# Patient Record
Sex: Male | Born: 1970 | Race: White | Hispanic: No | Marital: Married | Smoking: Never smoker
Health system: Southern US, Community
[De-identification: ages and names within clinical notes are randomized; demographics above are authoritative.]

## PROBLEM LIST (undated history)

## (undated) DIAGNOSIS — L409 Psoriasis, unspecified: Secondary | ICD-10-CM

## (undated) DIAGNOSIS — R17 Unspecified jaundice: Secondary | ICD-10-CM

## (undated) DIAGNOSIS — E7800 Pure hypercholesterolemia, unspecified: Secondary | ICD-10-CM

## (undated) DIAGNOSIS — H5461 Unqualified visual loss, right eye, normal vision left eye: Secondary | ICD-10-CM

## (undated) DIAGNOSIS — G8929 Other chronic pain: Secondary | ICD-10-CM

## (undated) DIAGNOSIS — N529 Male erectile dysfunction, unspecified: Secondary | ICD-10-CM

## (undated) DIAGNOSIS — J302 Other seasonal allergic rhinitis: Secondary | ICD-10-CM

## (undated) DIAGNOSIS — A6000 Herpesviral infection of urogenital system, unspecified: Secondary | ICD-10-CM

---

## 2019-08-29 ENCOUNTER — Other Ambulatory Visit: Payer: Self-pay | Admitting: Family Medicine

## 2019-08-29 DIAGNOSIS — E010 Iodine-deficiency related diffuse (endemic) goiter: Secondary | ICD-10-CM

## 2019-09-05 ENCOUNTER — Ambulatory Visit: Payer: 59

## 2019-09-25 ENCOUNTER — Ambulatory Visit
Admission: RE | Admit: 2019-09-25 | Discharge: 2019-09-25 | Disposition: A | Discharge status: Home / Self Care | Payer: 59 | Source: Ambulatory Visit | Attending: Family Medicine | Admitting: Family Medicine

## 2019-09-25 ENCOUNTER — Other Ambulatory Visit: Payer: Self-pay

## 2019-09-25 DIAGNOSIS — E010 Iodine-deficiency related diffuse (endemic) goiter: Secondary | ICD-10-CM | POA: Diagnosis not present

## 2020-05-10 ENCOUNTER — Other Ambulatory Visit: Admission: RE | Admit: 2020-05-10 | Payer: 59 | Source: Ambulatory Visit

## 2020-05-12 ENCOUNTER — Ambulatory Visit
Admission: RE | Admit: 2020-05-12 | Payer: Managed Care, Other (non HMO) | Source: Home / Self Care | Admitting: Internal Medicine

## 2020-05-12 ENCOUNTER — Encounter: Admission: RE | Payer: Self-pay | Source: Home / Self Care

## 2020-05-12 SURGERY — COLONOSCOPY WITH PROPOFOL
Anesthesia: General

## 2020-05-31 ENCOUNTER — Other Ambulatory Visit: Payer: Self-pay

## 2020-05-31 ENCOUNTER — Other Ambulatory Visit
Admission: RE | Admit: 2020-05-31 | Discharge: 2020-05-31 | Disposition: A | Discharge status: Home / Self Care | Payer: Managed Care, Other (non HMO) | Source: Ambulatory Visit | Attending: Internal Medicine | Admitting: Internal Medicine

## 2020-05-31 DIAGNOSIS — Z01812 Encounter for preprocedural laboratory examination: Secondary | ICD-10-CM | POA: Diagnosis not present

## 2020-05-31 DIAGNOSIS — U071 COVID-19: Secondary | ICD-10-CM | POA: Diagnosis not present

## 2020-05-31 LAB — SARS CORONAVIRUS 2 (TAT 6-24 HRS): SARS Coronavirus 2: POSITIVE — AB

## 2020-06-02 ENCOUNTER — Encounter: Admission: RE | Payer: Self-pay | Source: Home / Self Care

## 2020-06-02 ENCOUNTER — Ambulatory Visit
Admission: RE | Admit: 2020-06-02 | Payer: Managed Care, Other (non HMO) | Source: Home / Self Care | Admitting: Internal Medicine

## 2020-06-02 ENCOUNTER — Telehealth: Payer: Self-pay | Admitting: *Deleted

## 2020-06-02 SURGERY — COLONOSCOPY WITH PROPOFOL
Anesthesia: General

## 2020-06-02 NOTE — Telephone Encounter (Signed)
Called to discuss with patient about COVID-19 symptoms and the use of one of the available treatments for those with mild to moderate Covid symptoms and at a high risk of hospitalization.  Pt appears to qualify for outpatient treatment due to co-morbid conditions and/or a member of an at-risk group in accordance with the FDA Emergency Use Authorization.   Patient reported no symptoms.       Geoffrey Johnson

## 2020-07-26 ENCOUNTER — Other Ambulatory Visit: Admission: RE | Admit: 2020-07-26 | Payer: Managed Care, Other (non HMO) | Source: Ambulatory Visit

## 2020-07-27 ENCOUNTER — Encounter: Payer: Self-pay | Admitting: Internal Medicine

## 2020-07-28 ENCOUNTER — Encounter
Admission: RE | Disposition: A | Discharge status: Home / Self Care | Payer: Self-pay | Source: Home / Self Care | Attending: Internal Medicine

## 2020-07-28 ENCOUNTER — Ambulatory Visit
Admission: RE | Admit: 2020-07-28 | Discharge: 2020-07-28 | Disposition: A | Discharge status: Home / Self Care | Payer: Managed Care, Other (non HMO) | Attending: Internal Medicine | Admitting: Internal Medicine

## 2020-07-28 ENCOUNTER — Encounter: Payer: Self-pay | Admitting: Internal Medicine

## 2020-07-28 ENCOUNTER — Ambulatory Visit: Payer: Managed Care, Other (non HMO) | Admitting: Anesthesiology

## 2020-07-28 ENCOUNTER — Other Ambulatory Visit: Payer: Self-pay

## 2020-07-28 DIAGNOSIS — Z1211 Encounter for screening for malignant neoplasm of colon: Secondary | ICD-10-CM | POA: Diagnosis not present

## 2020-07-28 DIAGNOSIS — K640 First degree hemorrhoids: Secondary | ICD-10-CM | POA: Diagnosis not present

## 2020-07-28 DIAGNOSIS — K5730 Diverticulosis of large intestine without perforation or abscess without bleeding: Secondary | ICD-10-CM | POA: Insufficient documentation

## 2020-07-28 HISTORY — PX: COLONOSCOPY WITH PROPOFOL: SHX5780

## 2020-07-28 HISTORY — DX: Pure hypercholesterolemia, unspecified: E78.00

## 2020-07-28 HISTORY — DX: Other seasonal allergic rhinitis: J30.2

## 2020-07-28 HISTORY — DX: Male erectile dysfunction, unspecified: N52.9

## 2020-07-28 HISTORY — DX: Unqualified visual loss, right eye, normal vision left eye: H54.61

## 2020-07-28 HISTORY — DX: Psoriasis, unspecified: L40.9

## 2020-07-28 HISTORY — DX: Herpesviral infection of urogenital system, unspecified: A60.00

## 2020-07-28 HISTORY — DX: Unspecified jaundice: R17

## 2020-07-28 HISTORY — DX: Other chronic pain: G89.29

## 2020-07-28 SURGERY — COLONOSCOPY WITH PROPOFOL
Anesthesia: General

## 2020-07-28 MED ORDER — SODIUM CHLORIDE 0.9 % IV SOLN
INTRAVENOUS | Status: DC
  Administered 2020-07-28: 20 mL/h via INTRAVENOUS

## 2020-07-28 MED ORDER — PROPOFOL 500 MG/50ML IV EMUL
INTRAVENOUS | Status: DC | PRN
  Administered 2020-07-28: 200 ug/kg/min via INTRAVENOUS

## 2020-07-28 MED ORDER — PROPOFOL 10 MG/ML IV BOLUS
INTRAVENOUS | Status: DC | PRN
  Administered 2020-07-28 (×2): 20 mg via INTRAVENOUS
  Administered 2020-07-28: 60 mg via INTRAVENOUS

## 2020-07-28 NOTE — Anesthesia Preprocedure Evaluation (Signed)
Anesthesia Evaluation  Patient identified by MRN, date of birth, ID band Patient awake    Reviewed: Allergy & Precautions, NPO status , Patient's Chart, lab work & pertinent test results  Airway Mallampati: II  TM Distance: >3 FB     Dental   Pulmonary  Sinus allergies   Pulmonary exam normal        Cardiovascular negative cardio ROS Normal cardiovascular exam     Neuro/Psych negative neurological ROS  negative psych ROS   GI/Hepatic negative GI ROS, Elevated bili   Endo/Other  negative endocrine ROS  Renal/GU negative Renal ROS     Musculoskeletal  (+) Arthritis ,   Abdominal Normal abdominal exam  (+)   Peds negative pediatric ROS (+)  Hematology negative hematology ROS (+)   Anesthesia Other Findings Past Medical History: No date: Chronic pain in right foot No date: Elevated bilirubin No date: Erectile dysfunction 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 No date: Hypercholesterolemia No date: Psoriasis No date: Seasonal allergies No date: Visual loss, right eye  Reproductive/Obstetrics                             Anesthesia Physical Anesthesia Plan  ASA: II  Anesthesia Plan: General   Post-op Pain Management:    Induction: Intravenous  PONV Risk Score and Plan: Propofol infusion  Airway Management Planned: Nasal Cannula  Additional Equipment:   Intra-op Plan:   Post-operative Plan:   Informed Consent: I have reviewed the patients History and Physical, chart, labs and discussed the procedure including the risks, benefits and alternatives for the proposed anesthesia with the patient or authorized representative who has indicated his/her understanding and acceptance.     Dental advisory given  Plan Discussed with: CRNA and Surgeon  Anesthesia Plan Comments:         Anesthesia Quick Evaluation

## 2020-07-28 NOTE — H&P (Signed)
  Outpatient short stay form Pre-procedure 07/28/2020 10:05 AM Geoffrey Allie K. Geoffrey Johnson, M.D.  Primary Physician: Angus Palms MD  Reason for visit: Colon cancer screening  History of present illness:  Patient presents for colonoscopy for colon cancer screening. The patient denies complaints of abdominal pain, significant change in bowel habits, or rectal bleeding.     Current Facility-Administered Medications:  .  0.9 %  sodium chloride infusion, , Intravenous, Continuous, [place], Boykin Nearing, MD, Last Rate: 20 mL/hr at 07/28/20 0959, 20 mL/hr at 07/28/20 0959  Medications Prior to Admission  Medication Sig Dispense Refill Last Dose  . calcipotriene (DOVONOX) 0.005 % cream Apply topically 2 (two) times daily.   Past Week at Unknown time  . Calcipotriene 0.005 % FOAM Apply topically.   07/27/2020 at Unknown time  . clobetasol cream (TEMOVATE) 0.05 % Apply 1 application topically 2 (two) times daily.   07/27/2020 at Unknown time  . sildenafil (REVATIO) 20 MG tablet Take 20 mg by mouth 3 (three) times daily.   Past Week at Unknown time  . tazarotene (AVAGE) 0.1 % cream Apply topically at bedtime.   07/27/2020 at Unknown time  . Tazarotene 0.1 % FOAM Apply topically.   07/27/2020 at Unknown time     Not on File   Past Medical History:  Diagnosis Date  . Chronic pain in right foot   . Elevated bilirubin   . Erectile dysfunction   . Genital HSV   . Hypercholesterolemia   . Psoriasis   . Seasonal allergies   . Visual loss, right eye     Review of systems:  Otherwise negative.    Physical Exam  Gen: Alert, oriented. Appears stated age.  HEENT: [place]/AT. PERRLA. Lungs: CTA, no wheezes. CV: RR nl S1, S2. Abd: soft, benign, no masses. BS+ Ext: No edema. Pulses 2+    Planned procedures: Proceed with colonoscopy. The patient understands the nature of the planned procedure, indications, risks, alternatives and potential complications including but not limited to bleeding, infection,  perforation, damage to internal organs and possible oversedation/side effects from anesthesia. The patient agrees and gives consent to proceed.  Please refer to procedure notes for findings, recommendations and patient disposition/instructions.     Fidelis Loth K. Geoffrey Johnson, M.D. Gastroenterology 07/28/2020  10:05 AM

## 2020-07-28 NOTE — Op Note (Signed)
[facility] Gastroenterology Patient Name: Geoffrey Johnson Procedure Date: 07/28/2020 10:56 AM MRN: [id number] Account #: [id number] Date of Birth: 1970-10-23 Admit Type: Outpatient Age: 50 Room: [facility] ENDO ROOM 2 Gender: Male Note Status: Finalized Procedure:             Colonoscopy Indications:           Screening for colorectal malignant neoplasm Providers:             Boykin Nearing. Norma Fredrickson MD, MD Referring MD:          Marylin Crosby. Greggory Stallion MD, MD (Referring MD) Medicines:             Propofol per Anesthesia Complications:         No immediate complications. Procedure:             Pre-Anesthesia Assessment:                        - The risks and benefits of the procedure and the                         sedation options and risks were discussed with the                         patient. All questions were answered and informed                         consent was obtained.                        - Patient identification and proposed procedure were                         verified prior to the procedure by the nurse. The                         procedure was verified in the procedure room.                        - ASA Grade Assessment: II - A patient with mild                         systemic disease.                        - After reviewing the risks and benefits, the patient                         was deemed in satisfactory condition to undergo the                         procedure.                        After obtaining informed consent, the colonoscope was                         passed under direct vision. Throughout the procedure,                         the patient's blood  pressure, pulse, and oxygen                         saturations were monitored continuously. The                         Colonoscope was introduced through the anus and                         advanced to the the cecum, identified by appendiceal                         orifice and ileocecal valve.  The colonoscopy was                         performed without difficulty. The patient tolerated                         the procedure well. The quality of the bowel                         preparation was excellent. The ileocecal valve,                         appendiceal orifice, and rectum were photographed. Findings:      The perianal and digital rectal examinations were normal. Pertinent       negatives include normal sphincter tone and no palpable rectal lesions.      Non-bleeding internal hemorrhoids were found during retroflexion. The       hemorrhoids were Grade I (internal hemorrhoids that do not prolapse).      Many small-mouthed diverticula were found in the sigmoid colon. There       was no evidence of diverticular bleeding.      A 5 mm polyp was found in the distal sigmoid colon. The polyp was       sessile. The polyp was removed with a jumbo cold forceps. Resection and       retrieval were complete.      The exam was otherwise without abnormality. Impression:            - Non-bleeding internal hemorrhoids.                        - Mild diverticulosis in the sigmoid colon. There was                         no evidence of diverticular bleeding.                        - One 5 mm polyp in the distal sigmoid colon, removed                         with a jumbo cold forceps. Resected and retrieved.                        - The examination was otherwise normal. Recommendation:        - Patient has a contact number available for  emergencies. The signs and symptoms of potential                         delayed complications were discussed with the patient.                         Return to normal activities tomorrow. Written                         discharge instructions were provided to the patient.                        - Resume previous diet.                        - Continue present medications.                        - Repeat colonoscopy is recommended  for surveillance.                         The colonoscopy date will be determined after                         pathology results from today's exam become available                         for review.                        - Return to GI office PRN.                        - The findings and recommendations were discussed with                         the patient. Procedure Code(s):     --- Professional ---                        [id number], Colonoscopy, flexible; with biopsy, single or                         multiple Diagnosis Code(s):     --- Professional ---                        K57.30, Diverticulosis of large intestine without                         perforation or abscess without bleeding                        K63.5, Polyp of colon                        K64.0, First degree hemorrhoids                        Z12.11, Encounter for screening for malignant neoplasm                         of colon CPT  copyright 2019 [employer] Medical Association. All rights reserved. The codes documented in this report are preliminary and upon coder review may  be revised to meet current compliance requirements. Stanton Kidney MD, MD 07/28/2020 11:30:14 AM This report has been signed electronically. Number of Addenda: 0 Note Initiated On: 07/28/2020 10:56 AM Scope Withdrawal Time: 0 hours 8 minutes 31 seconds  Total Procedure Duration: 0 hours 11 minutes 42 seconds  Estimated Blood Loss:  Estimated blood loss: none.      [facility]

## 2020-07-28 NOTE — Interval H&P Note (Signed)
History and Physical Interval Note:  07/28/2020 11:01 AM  Geoffrey Johnson  has presented today for surgery, with the diagnosis of COLON CANCER SCREENING.  The various methods of treatment have been discussed with the patient and family. After consideration of risks, benefits and other options for treatment, the patient has consented to  Procedure(s) with comments: COLONOSCOPY WITH PROPOFOL (N/A) - COVID POSITIVE 05/31/2020 as a surgical intervention.  The patient's history has been reviewed, patient examined, no change in status, stable for surgery.  I have reviewed the patient's chart and labs.  Questions were answered to the patient's satisfaction.     [place], [place]

## 2020-07-28 NOTE — Transfer of Care (Signed)
Immediate Anesthesia Transfer of Care Note  Patient: Geoffrey Johnson  Procedure(s) Performed: COLONOSCOPY WITH PROPOFOL (N/A )  Patient Location: PACU  Anesthesia Type:General  Level of Consciousness: awake, alert  and oriented  Airway & Oxygen Therapy: Patient Spontanous Breathing and Patient connected to nasal cannula oxygen  Post-op Assessment: Report given to RN and Post -op Vital signs reviewed and stable  Post vital signs: Reviewed and stable  Last Vitals:  Vitals Value Taken Time  BP    Temp    Pulse    Resp    SpO2      Last Pain:  Vitals:   07/28/20 0946  TempSrc: Temporal  PainSc: 0-No pain         Complications: No complications documented.

## 2020-07-28 NOTE — Anesthesia Procedure Notes (Signed)
Performed by: Kariana Wiles, CRNA Pre-anesthesia Checklist: Patient identified, Emergency Drugs available, Suction available, Patient being monitored and Timeout performed Oxygen Delivery Method: Nasal cannula       

## 2020-07-29 ENCOUNTER — Encounter: Payer: Self-pay | Admitting: Internal Medicine

## 2020-07-29 LAB — SURGICAL PATHOLOGY

## 2020-07-29 NOTE — Anesthesia Postprocedure Evaluation (Signed)
Anesthesia Post Note  Patient: Geoffrey Johnson  Procedure(s) Performed: COLONOSCOPY WITH PROPOFOL (N/A )  Patient location during evaluation: Endoscopy Anesthesia Type: General Level of consciousness: awake and alert and oriented Pain management: pain level controlled Vital Signs Assessment: post-procedure vital signs reviewed and stable Respiratory status: spontaneous breathing Cardiovascular status: blood pressure returned to baseline Anesthetic complications: no   No complications documented.   Last Vitals:  Vitals:   07/28/20 1139 07/28/20 1148  BP: 114/88 (!) 123/92  Pulse: (!) 49 (!) 58  Resp: 12   Temp:    SpO2: 100% 100%    Last Pain:  Vitals:   07/29/20 0720  TempSrc:   PainSc: 0-No pain                 Shawntee Mainwaring

## 2022-01-19 IMAGING — US US THYROID
1 series · 14 of 25 positions shown · non-contrast
Comparison: None.

CLINICAL DATA: Right thyroid enlargement

EXAM:
THYROID ULTRASOUND
TECHNIQUE: Ultrasound examination of the thyroid gland and adjacent soft
tissues was performed.

[Series 1: us thyroid · 0.07mm/px · 14 of 41 slices shown]
[im 1/41]
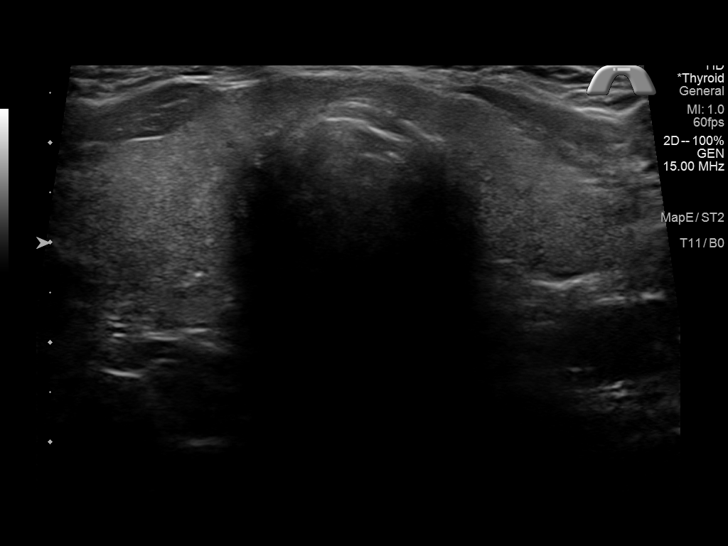
[im 4/41]
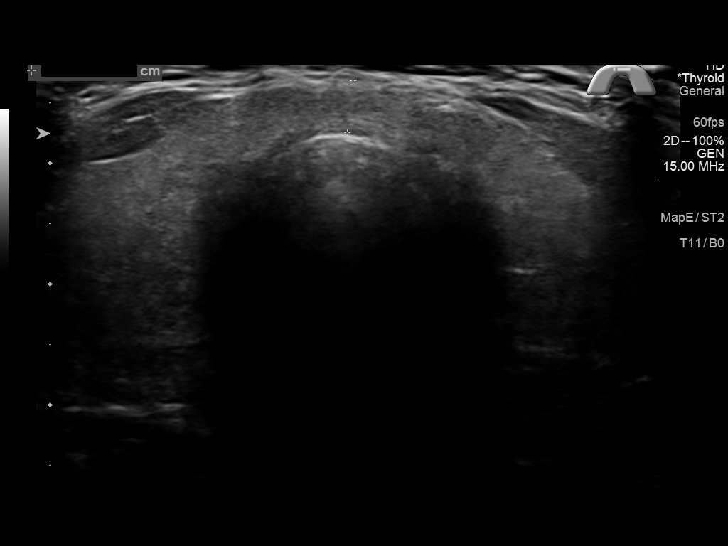
[im 7/41]
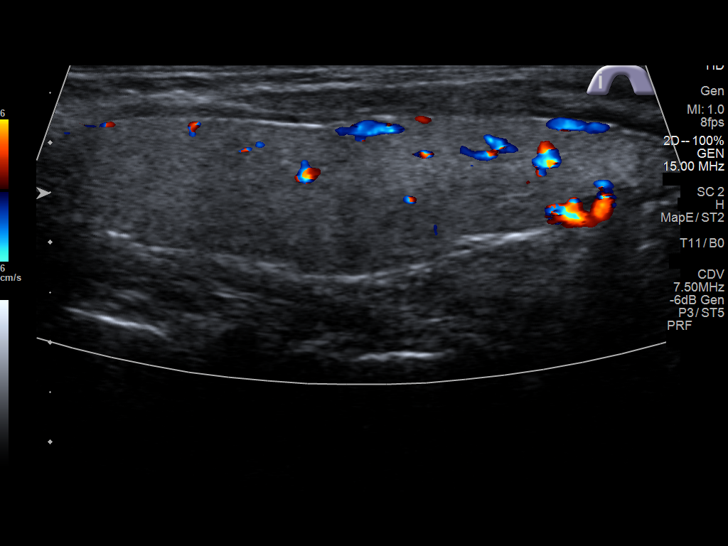
[im 11/41]
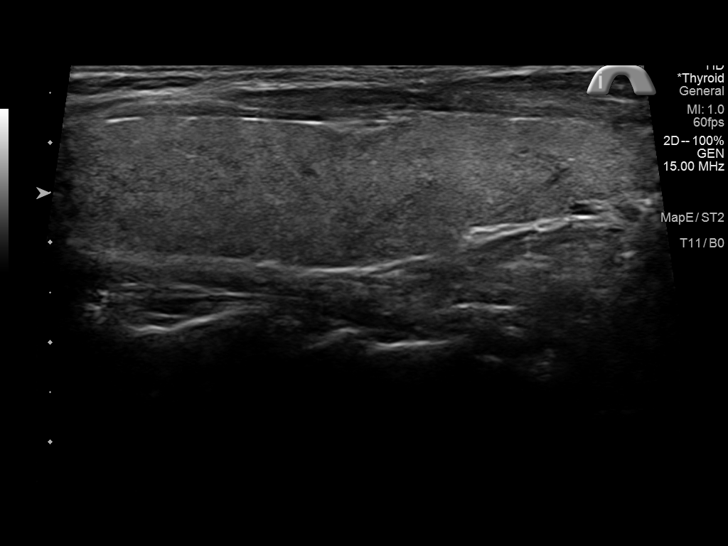
[im 14/41]
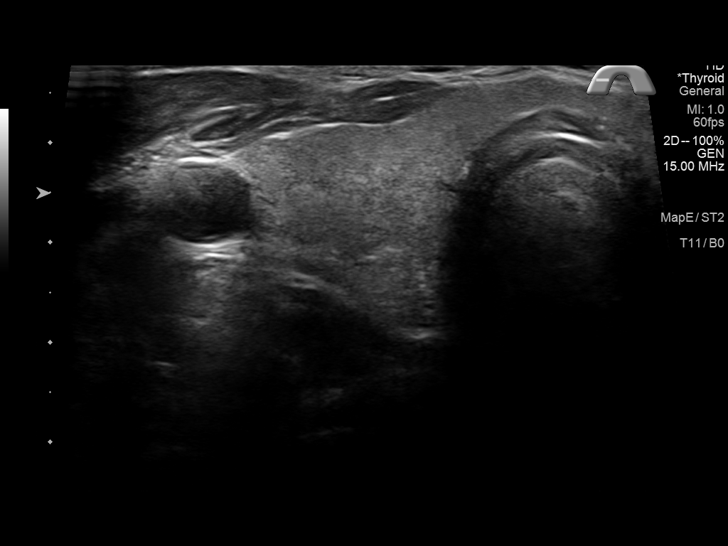
[im 16/41]
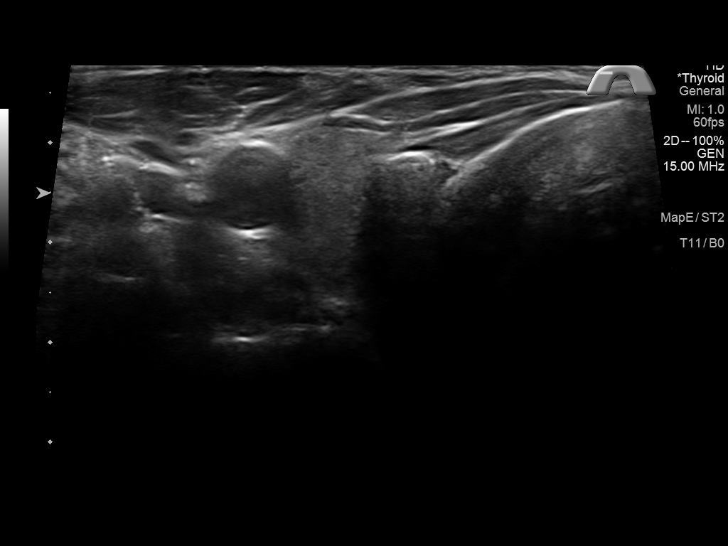
[im 19/41]
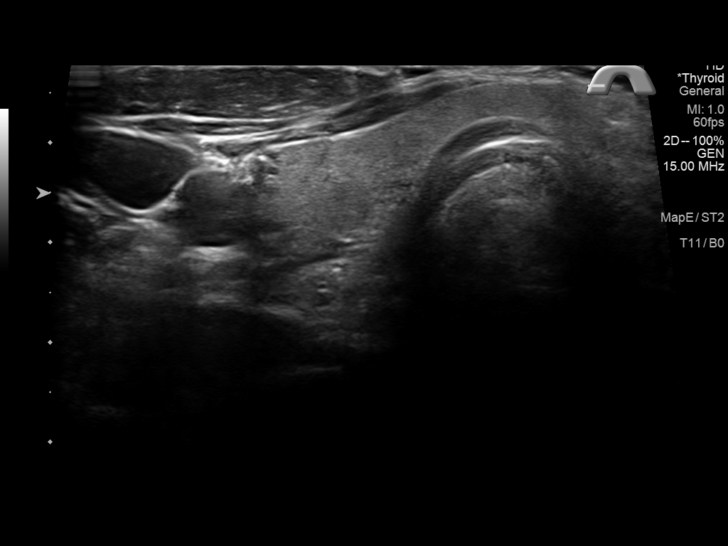
[im 22/41]
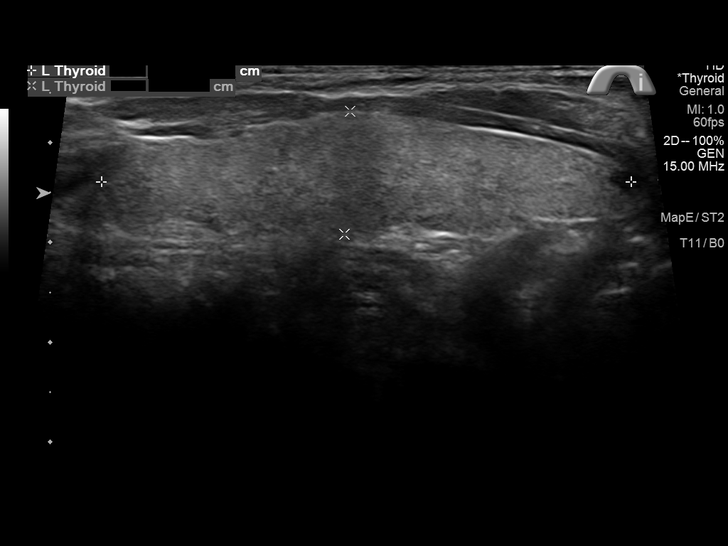
[im 26/41]
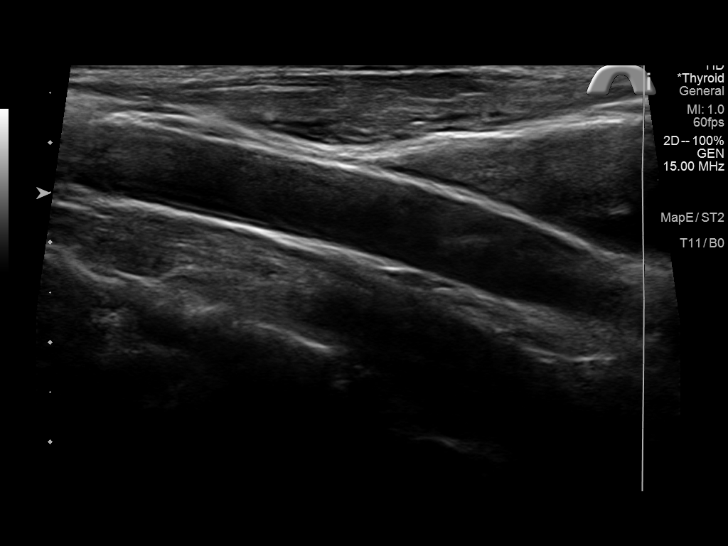
[im 27/41]
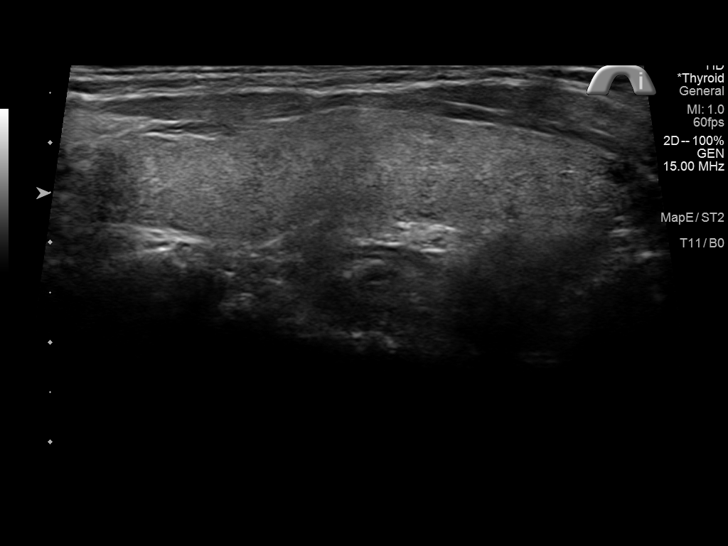
[im 31/41]
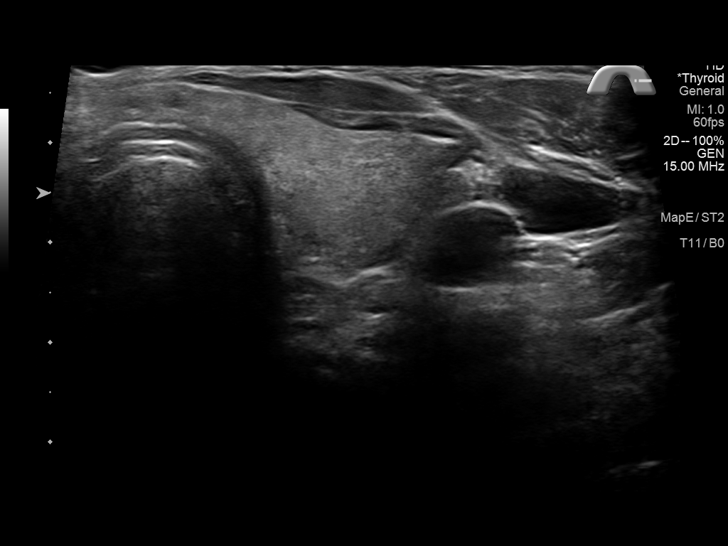
[im 34/41]
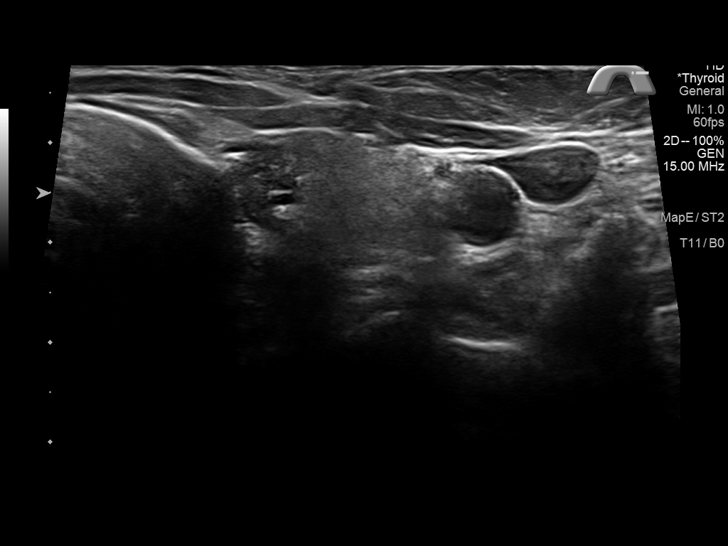
[im 37/41]
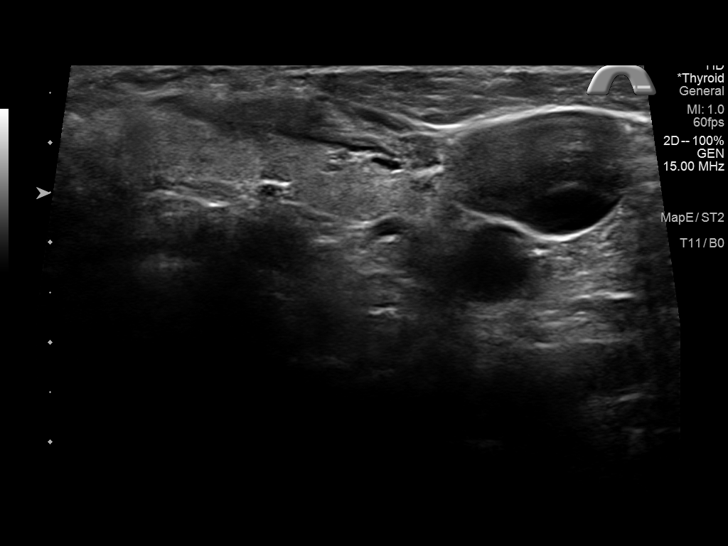
[im 41/41]
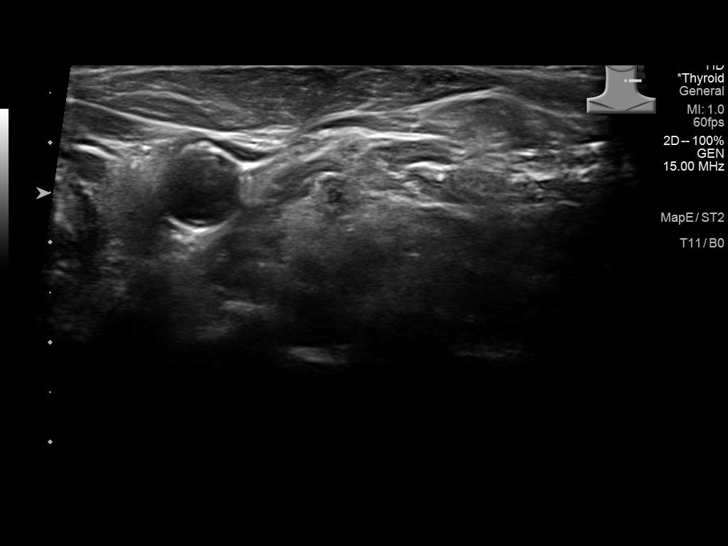

[14 of 25 positions shown; findings below may reference images not displayed]

FINDINGS: Parenchymal Echotexture: Normal

Isthmus: 0.4 cm

Right lobe: 6 x 1.5 x 2 cm

Left lobe: 5.3 x 1.2 x 1.8 cm

_________________________________________________________

Estimated total number of nodules >/= 1 cm: 0

Number of spongiform nodules >/=  2 cm not described below (TR1): 0

Number of mixed cystic and solid nodules >/= 1.5 cm not described
below (TR2): 0

_________________________________________________________

No discrete nodules are seen within the thyroid gland.
IMPRESSION: Enlarged thyroid gland without evidence for distinct thyroid nodule.

The above is in keeping with the ACR TI-RADS recommendations - [HOSPITAL] 7482;[DATE].
